# Patient Record
Sex: Male | Born: 2000 | Race: White | Hispanic: No | Marital: Single | Smoking: Never smoker
Health system: Southern US, Community
[De-identification: ages and names within clinical notes are randomized; demographics above are authoritative.]

## PROBLEM LIST (undated history)

## (undated) DIAGNOSIS — J45909 Unspecified asthma, uncomplicated: Secondary | ICD-10-CM

## (undated) HISTORY — PX: WISDOM TOOTH EXTRACTION: SHX21

---

## 2021-11-05 ENCOUNTER — Encounter: Payer: Self-pay | Admitting: Emergency Medicine

## 2021-11-05 ENCOUNTER — Other Ambulatory Visit: Payer: Self-pay

## 2021-11-05 ENCOUNTER — Emergency Department
Admission: EM | Admit: 2021-11-05 | Discharge: 2021-11-05 | Disposition: A | Discharge status: Home / Self Care | Payer: BC Managed Care – PPO | Attending: Emergency Medicine | Admitting: Emergency Medicine

## 2021-11-05 ENCOUNTER — Emergency Department: Payer: BC Managed Care – PPO

## 2021-11-05 DIAGNOSIS — R1032 Left lower quadrant pain: Secondary | ICD-10-CM | POA: Diagnosis not present

## 2021-11-05 DIAGNOSIS — J45909 Unspecified asthma, uncomplicated: Secondary | ICD-10-CM | POA: Insufficient documentation

## 2021-11-05 DIAGNOSIS — N50812 Left testicular pain: Secondary | ICD-10-CM | POA: Insufficient documentation

## 2021-11-05 HISTORY — DX: Unspecified asthma, uncomplicated: J45.909

## 2021-11-05 LAB — URINALYSIS, COMPLETE (UACMP) WITH MICROSCOPIC
Bilirubin Urine: NEGATIVE
Glucose, UA: NEGATIVE mg/dL
Hgb urine dipstick: NEGATIVE
Leukocytes,Ua: NEGATIVE
Nitrite: NEGATIVE
Specific Gravity, Urine: 1.02 (ref 1.005–1.030)
Squamous Epithelial / HPF: NONE SEEN (ref 0–5)
pH: 7.5 (ref 5.0–8.0)

## 2021-11-05 NOTE — ED Provider Notes (Signed)
[facility] Provider Note    Event Date/Time   First MD Initiated Contact with Patient 11/05/21 1529     (approximate)   History   Chief Complaint Testicle Pain   HPI Dustin Kane is a 21 y.o. male, history of asthma, presents to the emergency department for evaluation of testicular pain.  Patient states that the pain is predominantly on the left side and started around 2 hours ago.  Reports that the left testicle is swollen and additionally endorses some discomfort in his left lower quadrant of his abdomen.  He is sexually active.  Denies fever/chills, urinary symptoms, back/flank pain, chest pain, shortness of breath, diarrhea, or nausea/vomiting.  History Limitations: No limitations.      Physical Exam  Triage Vital Signs: ED Triage Vitals  Enc Vitals Group     BP 11/05/21 1505 (!) 155/99     Pulse Rate 11/05/21 1505 (!) 102     Resp 11/05/21 1505 20     Temp 11/05/21 1505 97.9 F (36.6 C)     Temp Source 11/05/21 1505 Oral     SpO2 11/05/21 1505 98 %     Weight 11/05/21 1506 170 lb (77.1 kg)     Height 11/05/21 1506 6\' 1"  (1.854 m)     Head Circumference --      Peak Flow --      Pain Score 11/05/21 1506 5     Pain Loc --      Pain Edu? --      Excl. in GC? --     Most recent vital signs: Vitals:   11/05/21 1505  BP: (!) 155/99  Pulse: (!) 102  Resp: 20  Temp: 97.9 F (36.6 C)  SpO2: 98%    General: Awake, NAD.  CV: Good peripheral perfusion.  Resp: Normal effort.  Abd: Soft, non-tender. No distention.  Neuro: At baseline. No gross neurological deficits. Other: Left testicle is tender to palpation, though does not appear to be remarkably edematous or erythematous.  No evidence of hernia or masses.  Left testicle is appropriately adjacent to the right testicle.  Small varicocele present.  Physical Exam    ED Results / Procedures / Treatments  Labs (all labs ordered are listed, but only abnormal results are  displayed) Labs Reviewed  URINALYSIS, COMPLETE (UACMP) WITH MICROSCOPIC - Abnormal; Notable for the following components:      Result Value   Ketones, ur TRACE (*)    Protein, ur TRACE (*)    Bacteria, UA RARE (*)    All other components within normal limits  CHLAMYDIA/NGC RT PCR ([facility] ONLY)               EKG Not applicable.   RADIOLOGY  ED Provider Interpretation: I personally reviewed and interpreted this image.  No evidence of torsion.  01/03/22 SCROTUM W/DOPPLER  Result Date: 11/05/2021 CLINICAL DATA:  Left testicular pain over the last 3 hours. EXAM: SCROTAL ULTRASOUND DOPPLER ULTRASOUND OF THE TESTICLES TECHNIQUE: Complete ultrasound examination of the testicles, epididymis, and other scrotal structures was performed. Color and spectral Doppler ultrasound were also utilized to evaluate blood flow to the testicles. COMPARISON:  None. FINDINGS: Right testicle Measurements: 4.5 x 1.7 x 2.9 cm. No focal finding. Normal color flow and Doppler appearance. Left testicle Measurements: 4.5 x 2.0 x 2.6 cm. No focal finding. Normal color flow and Doppler appearance. Right epididymis:  Normal in size and appearance. Left epididymis:  Normal in size and appearance. Hydrocele:  None visualized. Varicocele:  Small varicocele on the left. Pulsed Doppler interrogation of both testes demonstrates normal low resistance arterial and venous waveforms bilaterally. IMPRESSION: Normal scrotal ultrasound. Testicles appear symmetric and normal. Incidental small varicocele on the left. Electronically Signed   By: Paulina Fusi M.D.   On: 11/05/2021 16:41    PROCEDURES:  Critical Care performed: None.  Procedures    MEDICATIONS ORDERED IN ED: Medications - No data to display   IMPRESSION / MDM / ASSESSMENT AND PLAN / ED COURSE  I reviewed the triage vital signs and the nursing notes.                              Dustin Kane is a 21 y.o. male, history of asthma, presents to the emergency department for  evaluation of testicular pain.  Patient states that the pain is predominantly on the left side and started around 2 hours ago.  Reports that the left testicle is swollen and additionally endorses some discomfort in his left lower quadrant of his abdomen.  Differential diagnosis includes, but is not limited to, testicular torsion, epididymitis, orchitis, inguinal hernia, nephrolithiasis, UTI  ED Course Patient appears well.  Vitals are within normal limits.  Currently comfortable.  Ultrasound negative for testicular torsion or findings suggestive of epididymitis.  Incidental small varicocele present.  Urinalysis shows no clear evidence of urinary tract infection.   Assessment/Plan Given the patient's history, physical exam, and work-up thus far, I do not suspect any serious pathology.  The patient states testicular pain is mild.  Ultrasound is reassuring for rule out testicular torsion no symptoms or signs suggestive of infection, though gonorrhea/chlamydia is pending.   Patient stated that he will look up his results on MyChart and follow-up with his regular doctor as needed.  Pain is possibly due to varicocele, though it is reducible on exam. We will plan to discharge this patient.  Patient was provided with anticipatory guidance, return precautions, and educational material. Encouraged the patient to return to the emergency department at any time if they begin to experience any new or worsening symptoms.       FINAL CLINICAL IMPRESSION(S) / ED DIAGNOSES   Final diagnoses:  Testicular pain, left     Rx / DC Orders   ED Discharge Orders     None        Note:  This document was prepared using Dragon voice recognition software and may include unintentional dictation errors.   Varney Daily, [place] 11/05/21 1736    Jene Every, MD 11/05/21 [phone]

## 2021-11-05 NOTE — ED Triage Notes (Signed)
Pt via POV from home. Pt states pt c/o L testicle pain since around 1:00pm. Denies burning when he pees. Pt states the L testicle is also swollen. Pt is A&OX4 and NAD.

## 2021-11-05 NOTE — Discharge Instructions (Addendum)
-  Follow-up with your primary care provider for GC/chlamydia results. -Return to the emergency department at any time if you begin to experience any new or worsening symptoms

## 2021-11-05 NOTE — ED Notes (Signed)
Provider at bedside. Pt states L testicle started hurting this afternoon. Pt denies concern for STIs and denies urinary symptoms. L testicle does not appear swollen but is mildly tender to palpation by provider.

## 2021-11-06 LAB — CHLAMYDIA/NGC RT PCR (ARMC ONLY)
Chlamydia Tr: NOT DETECTED
N gonorrhoeae: NOT DETECTED

## 2023-01-18 IMAGING — US US SCROTUM W/ DOPPLER COMPLETE
1 series · 14 of 25 positions shown · non-contrast
Comparison: None.

CLINICAL DATA: Left testicular pain over the last 3 hours.

EXAM:
SCROTAL ULTRASOUND
DOPPLER ULTRASOUND OF THE TESTICLES
TECHNIQUE: Complete ultrasound examination of the testicles, epididymis, and
other scrotal structures was performed. Color and spectral Doppler
ultrasound were also utilized to evaluate blood flow to the
testicles.

[Series 1: us scrotum w/doppler · 14 of 163 slices shown]
[im 1/163]
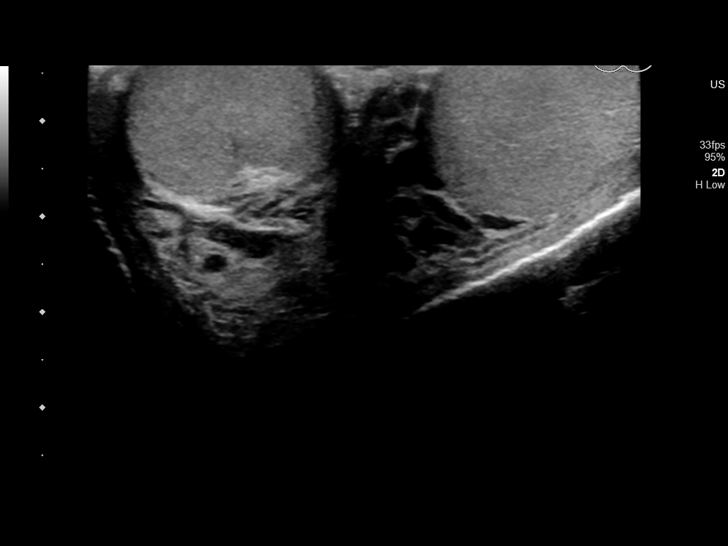
[im 14/163]
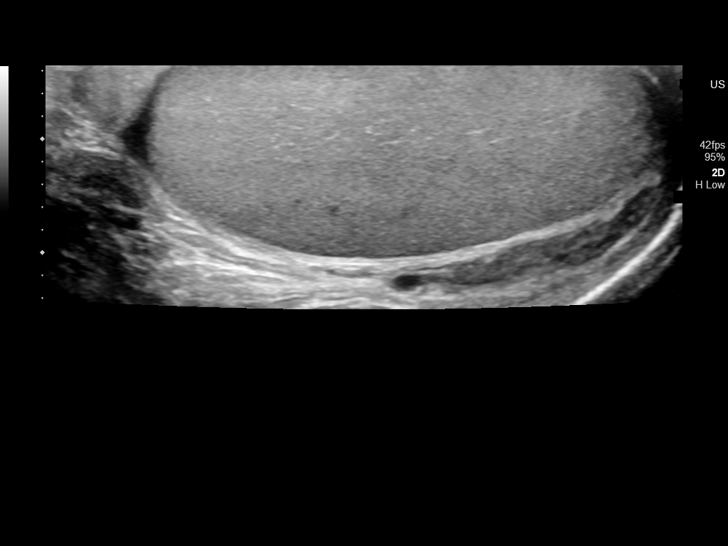
[im 28/163]
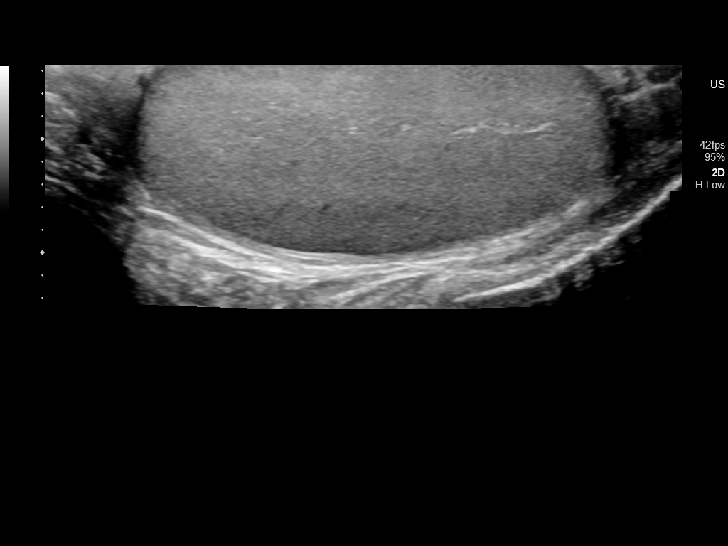
[im 41/163]
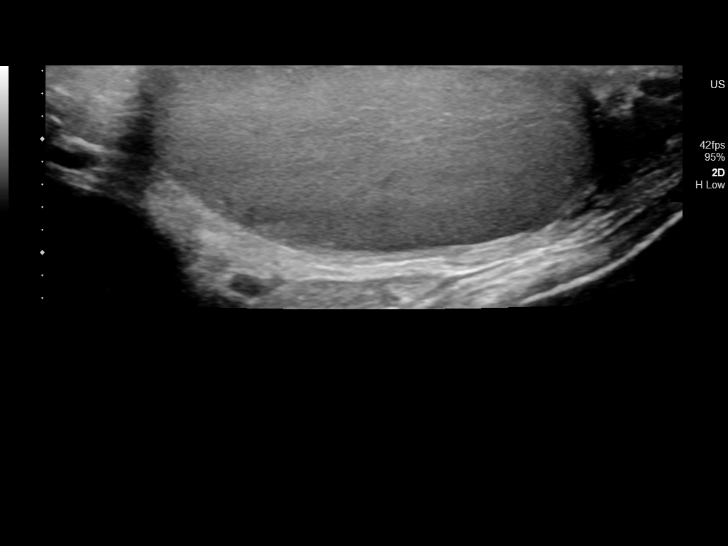
[im 55/163]
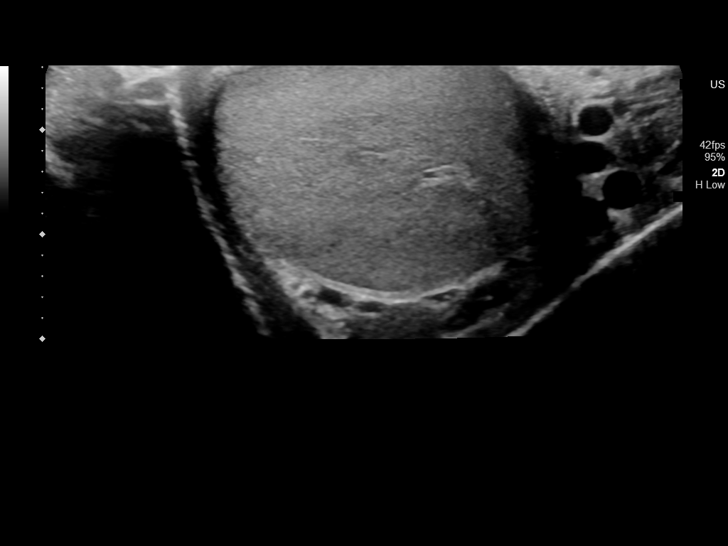
[im 61/163]
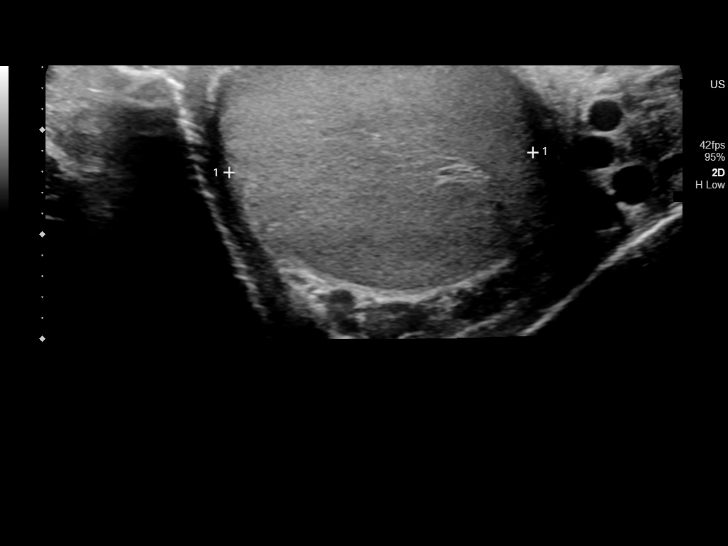
[im 75/163]
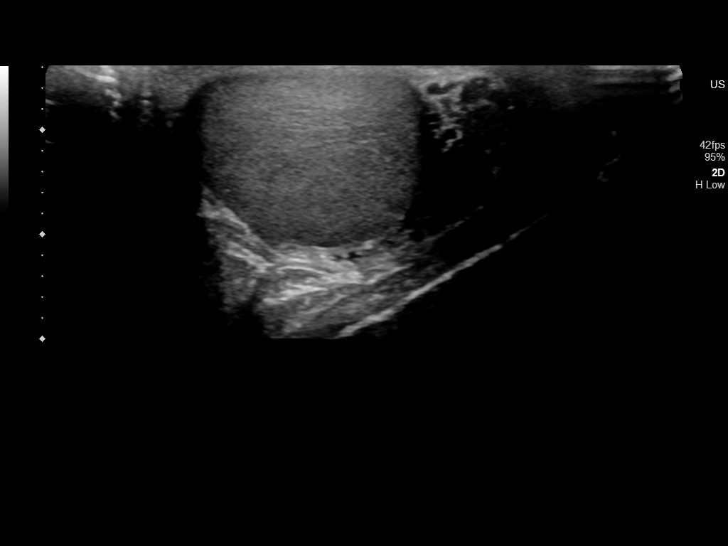
[im 88/163]
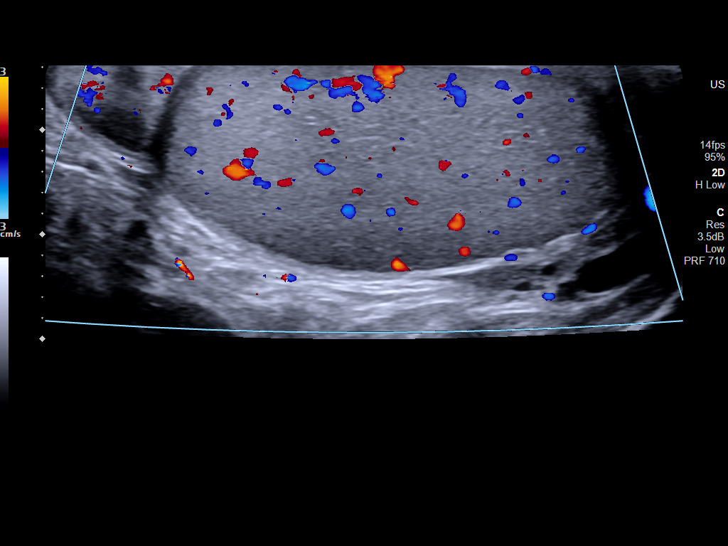
[im 102/163]
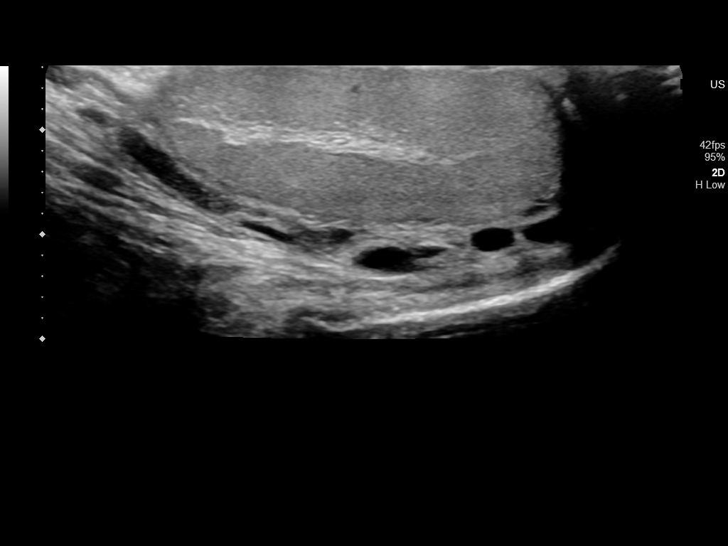
[im 109/163]
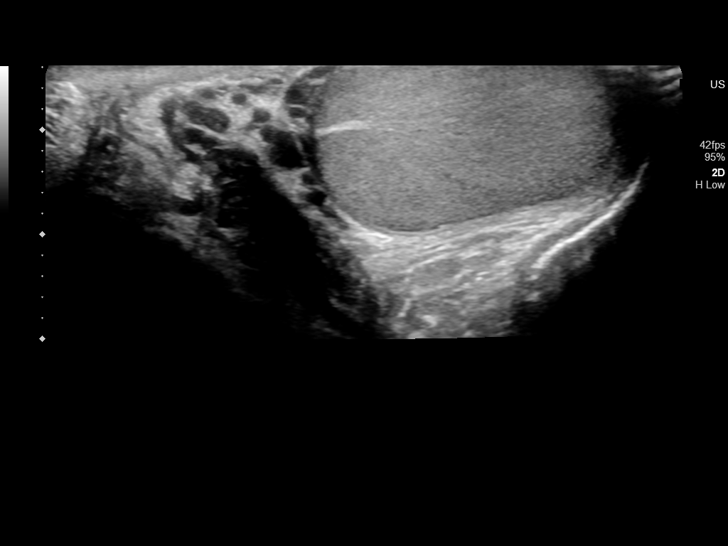
[im 122/163]
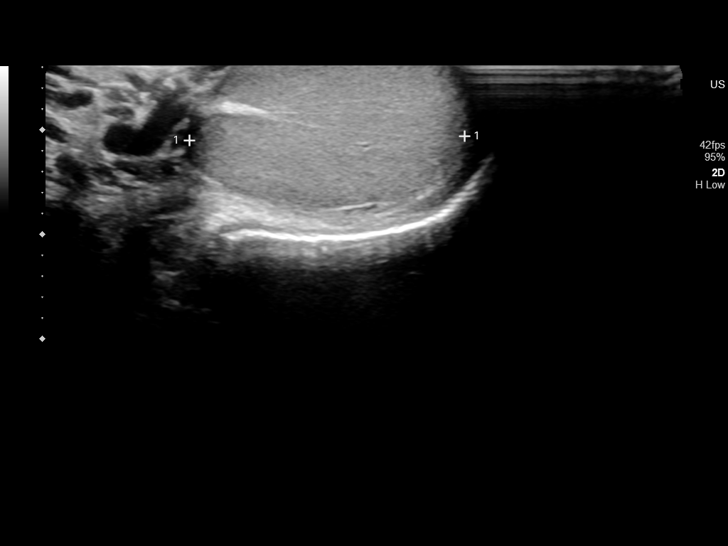
[im 136/163]
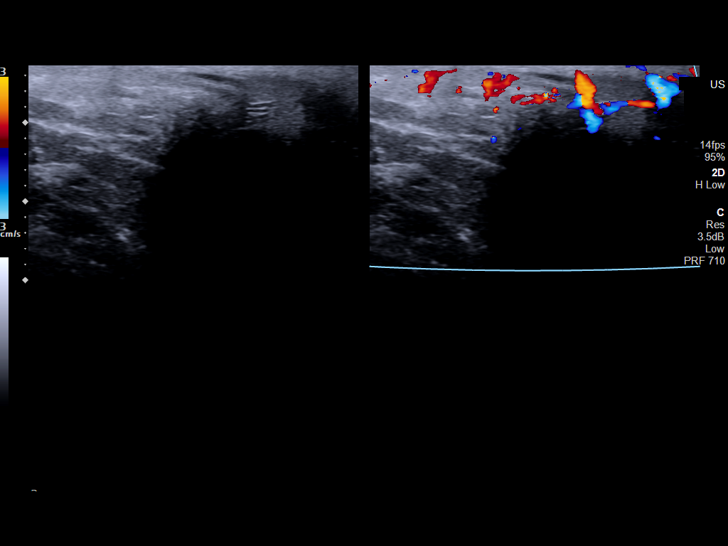
[im 149/163]
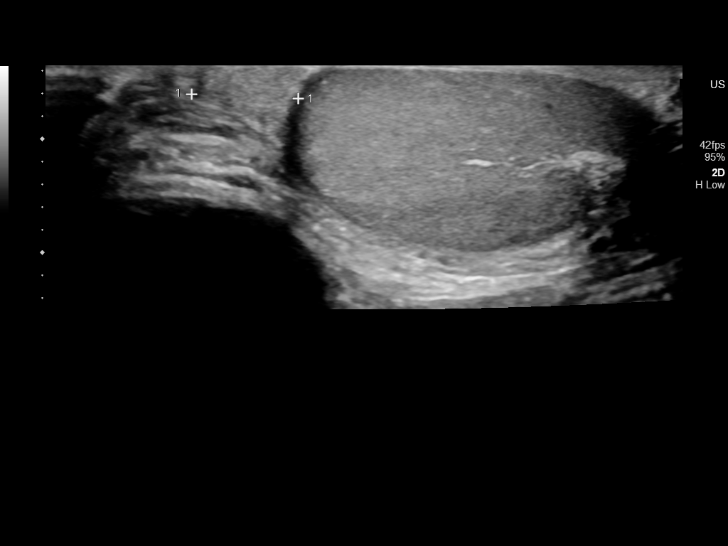
[im 163/163]
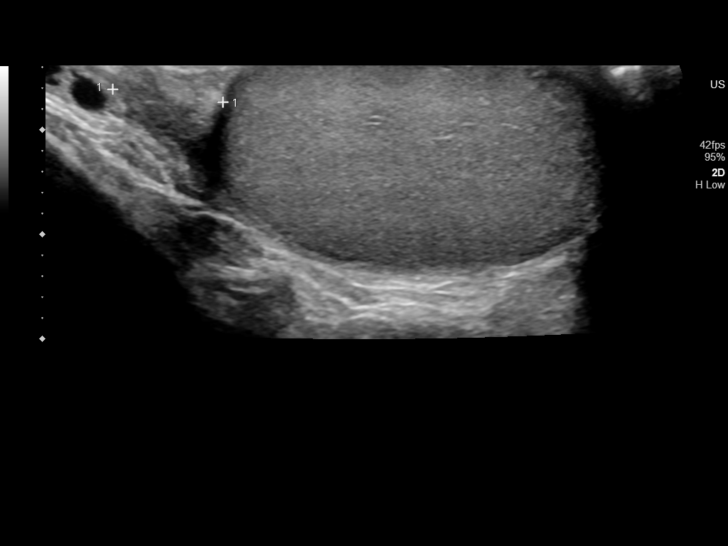

[14 of 25 positions shown; findings below may reference images not displayed]

FINDINGS: Right testicle

Measurements: 4.5 x 1.7 x 2.9 cm. No focal finding. Normal color
flow and Doppler appearance.

Left testicle

Measurements: 4.5 x 2.0 x 2.6 cm. No focal finding. Normal color
flow and Doppler appearance.

Right epididymis:  Normal in size and appearance.

Left epididymis:  Normal in size and appearance.

Hydrocele:  None visualized.

Varicocele:  Small varicocele on the left.

Pulsed Doppler interrogation of both testes demonstrates normal low
resistance arterial and venous waveforms bilaterally.
IMPRESSION: Normal scrotal ultrasound. Testicles appear symmetric and normal.
Incidental small varicocele on the left.

## 2023-12-11 ENCOUNTER — Ambulatory Visit (INDEPENDENT_AMBULATORY_CARE_PROVIDER_SITE_OTHER): Admitting: Adult Health

## 2023-12-11 ENCOUNTER — Encounter: Payer: Self-pay | Admitting: Adult Health

## 2023-12-11 VITALS — HR 67 | Temp 99.8°F | Ht 73.0 in | Wt 164.0 lb

## 2023-12-11 DIAGNOSIS — J029 Acute pharyngitis, unspecified: Secondary | ICD-10-CM | POA: Diagnosis not present

## 2023-12-11 LAB — POCT RAPID STREP A (OFFICE): Rapid Strep A Screen: NEGATIVE

## 2023-12-11 MED ORDER — AZITHROMYCIN 250 MG PO TABS
ORAL_TABLET | ORAL | 0 refills | Status: AC
Start: 2023-12-11 — End: 2023-12-16

## 2023-12-11 NOTE — Progress Notes (Signed)
 [facility] [address] [place], [place] [address] Phone: [phone] Fax: [phone]   Office Visit Note  Patient Name: Dustin Kane  Date of Birth:2001-06-06  Med Rec number [id number]  Date of Service: 12/11/2023  Patient has no known allergies.  Chief Complaint  Patient presents with   Acute Visit     HPI Pt reports yesterday morning he noticed some sore throat and body aches.  He improved through the day, but woke up this morning worse.  He as reports some fever/chills, and cough.  Denies headache, ear pain, PND or congestion.  Denies sick contacts.  Denies any OTC med use.    Current Medication:  Outpatient Encounter Medications as of 12/11/2023  Medication Sig   azithromycin (ZITHROMAX) 250 MG tablet Take 2 tablets on day 1, then 1 tablet daily on days 2 through 5   No facility-administered encounter medications on file as of 12/11/2023.      Medical History: Past Medical History:  Diagnosis Date   Asthma      Vital Signs: Pulse 67   Temp 99.8 F (37.7 C) (Tympanic)   Ht 6\' 1"  (1.854 m)   Wt 164 lb (74.4 kg)   SpO2 100%   BMI 21.64 kg/m    Review of Systems  Constitutional:  Positive for chills and fever. Negative for fatigue.  HENT:  Positive for sore throat. Negative for congestion and ear pain.   Eyes:  Negative for pain and redness.  Respiratory:  Positive for cough.   Musculoskeletal:  Positive for myalgias.  Neurological:  Negative for headaches.    Physical Exam Vitals reviewed.  Constitutional:      Appearance: Normal appearance.  HENT:     Head: Normocephalic.     Right Ear: Tympanic membrane and ear canal normal.     Left Ear: Tympanic membrane and ear canal normal.     Nose: Nose normal.     Mouth/Throat:     Mouth: Mucous membranes are moist.     Pharynx: Posterior oropharyngeal erythema present.  Eyes:     Pupils: Pupils are equal, round, and reactive to light.  Cardiovascular:     Rate and Rhythm: Normal  rate.  Pulmonary:     Effort: Pulmonary effort is normal.     Breath sounds: Normal breath sounds.  Lymphadenopathy:     Cervical: Cervical adenopathy present.  Neurological:     Mental Status: He is alert.     Assessment/Plan: 1. Pharyngitis, unspecified etiology (Primary) Finish all antibiotics as prescribed, even when you are feeling better.  TAke antibiotic with food.  Take an over-the-counter pain reliever/anti-inflammatory (such as ibuprofen) to help relieve pain or fever.  Rest and drink plenty of water.  Drinking warm or cold liquids (such as tea, soup or smoothies) and eating soft foods (such as oatmeal) may be more comfortable until your throat pain improves.  Do not share cups/water bottles/ utensils with others.  Wash your hands or use hand sanitizer often, especially before/after eating and after coughing into your hand or blowing your nose.  Send secure message to provider or schedule return appointment as needed for new/worsening symptoms (such as increased throat pain) or if your symptoms are not improving after 2-3 days on the antibiotic.     2. Sore throat - POCT rapid strep A     General Counseling: Bless verbalizes understanding of the findings of todays visit and agrees with plan of treatment. I have discussed any further diagnostic evaluation  that may be needed or ordered today. We also reviewed his medications today. he has been encouraged to call the office with any questions or concerns that should arise related to todays visit.   Orders Placed This Encounter  Procedures   POCT rapid strep A    Meds ordered this encounter  Medications   azithromycin (ZITHROMAX) 250 MG tablet    Sig: Take 2 tablets on day 1, then 1 tablet daily on days 2 through 5    Dispense:  6 tablet    Refill:  0    Time spent:15 Minutes Time spent includes review of chart, medications, test results, and follow up plan with the patient.    Johnna Acosta AGNP-C Nurse  Practitioner

## 2024-01-06 ENCOUNTER — Other Ambulatory Visit: Payer: Self-pay

## 2024-01-06 ENCOUNTER — Encounter: Payer: Self-pay | Admitting: Medical

## 2024-01-06 ENCOUNTER — Ambulatory Visit (INDEPENDENT_AMBULATORY_CARE_PROVIDER_SITE_OTHER): Admitting: Medical

## 2024-01-06 VITALS — BP 118/81 | HR 69 | Temp 97.0°F

## 2024-01-06 DIAGNOSIS — H60501 Unspecified acute noninfective otitis externa, right ear: Secondary | ICD-10-CM | POA: Diagnosis not present

## 2024-01-06 DIAGNOSIS — H6121 Impacted cerumen, right ear: Secondary | ICD-10-CM | POA: Diagnosis not present

## 2024-01-06 DIAGNOSIS — H9201 Otalgia, right ear: Secondary | ICD-10-CM | POA: Diagnosis not present

## 2024-01-06 MED ORDER — OFLOXACIN 0.3 % OT SOLN: 10.0000 [drp] | Freq: Every day | OTIC | 0 refills | Status: AC

## 2024-01-06 NOTE — Patient Instructions (Signed)
-   Use antibiotic eardrops as prescribed.  I recommend putting a small piece of cotton in your ear after putting the drops in. -Try to keep water out of your ear canal for the next 5 to 7 days. -Do not put Q-tips inside your ear canal. -Send MyChart message to provider or schedule return visit as needed for new/worsening symptoms or if symptoms do not improve as discussed with prescribed treatment over next 3-5 days.

## 2024-01-06 NOTE — Progress Notes (Signed)
 [facility] [address] [place], [place] [address] Phone: [phone] Fax: [phone]   Office Visit Note  Patient Name: Dustin Kane  Date of Birth:May 15, 2001  Med Rec number [id number]  Date of Service: 01/06/2024  Allergies: Patient has no known allergies.  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  Woke up this AM with right ear pressure/discomfort, also has a sharp pain when he swallows. No fever or chills. Patient did clean his ears yesterday with q-tips, did not have pain/discomfort with this.  Had strep throat about 3 weeks ago, finished abx.  Denies any head/nasal congestion. Denies hx of allergies. No tx attempted so far.   Had ear infections in early childhood, none recently.   Current Medication:  No outpatient encounter medications on file as of 01/06/2024.   No facility-administered encounter medications on file as of 01/06/2024.      Medical History: Past Medical History:  Diagnosis Date   Asthma      Vital Signs: BP 118/81   Pulse 69   Temp (!) 97 F (36.1 C) (Tympanic)   SpO2 99%    Review of Systems  Constitutional:  Negative for chills and fever.  HENT:  Positive for ear pain. Negative for congestion, ear discharge and sore throat.     Physical Exam Vitals reviewed.  Constitutional:      General: He is not in acute distress.    Appearance: He is not ill-appearing.  HENT:     Right Ear: Hearing and external ear normal.     Left Ear: Hearing, tympanic membrane, ear canal and external ear normal.     Ears:     Comments: Moderate thick cerumen to right EAC, partially obstructs canal.  Visible portion of TM normal.    Nose: No mucosal edema, congestion or rhinorrhea.     Right Turbinates: Swollen (mild).     Left Turbinates: Swollen (mild).     Mouth/Throat:     Mouth: Mucous membranes are moist.     Pharynx: Uvula midline. No  pharyngeal swelling, posterior oropharyngeal erythema or uvula swelling.     Tonsils: No tonsillar exudate. 0 on the right. 0 on the left.  Musculoskeletal:     Cervical back: Neck supple.  Lymphadenopathy:     Cervical: No cervical adenopathy.  Neurological:     Mental Status: He is alert.     Assessment/Plan: 1. Right ear pain (Primary) 2. Acute otitis externa of right ear, unspecified type 3. Cerumen debris on tympanic membrane of right ear  - ofloxacin (FLOXIN) 0.3 % OTIC solution; Place 10 drops into the right ear daily for 7 days.  Dispense: 5 mL; Refill: 0  Used combination of ear irrigation and ear curette to remove cerumen from right ear. Patient did note some improvement to discomfort following cerumen removal. Ear re-examined - mild erythema to superior aspect of right TM and adjacent ear canal. Will cover with ear drops for possible mild otitis externa. No evidence of OM.   Patient Instructions  - Use antibiotic eardrops as prescribed.  I recommend putting a small piece of cotton in your ear after putting the drops in. -Try to keep water out of your ear canal for the next 5 to 7 days. -Do not put Q-tips inside your ear canal. -Send MyChart message to provider or schedule return visit as needed for new/worsening symptoms or  if symptoms do not improve as discussed with prescribed treatment over next 3-5 days.    General Counseling: Dustin Kane verbalizes understanding of the findings of todays visit and plan of treatment. he has been encouraged to call the office with any questions or concerns that should arise related to todays visit.  Time spent:20 Minutes    Jonathon Resides PA-C [facility] 01/06/2024 9:17 AM
